# Patient Record
Sex: Female | Born: 1980 | Race: White | Hispanic: Yes | Smoking: Never smoker
Health system: Southern US, Community
[De-identification: ages and names within clinical notes are randomized; demographics above are authoritative.]

## PROBLEM LIST (undated history)

## (undated) DIAGNOSIS — Z62810 Personal history of physical and sexual abuse in childhood: Secondary | ICD-10-CM

## (undated) DIAGNOSIS — R102 Pelvic and perineal pain: Secondary | ICD-10-CM

## (undated) DIAGNOSIS — I10 Essential (primary) hypertension: Secondary | ICD-10-CM

## (undated) DIAGNOSIS — N342 Other urethritis: Secondary | ICD-10-CM

## (undated) DIAGNOSIS — K5909 Other constipation: Secondary | ICD-10-CM

## (undated) DIAGNOSIS — G8929 Other chronic pain: Secondary | ICD-10-CM

## (undated) DIAGNOSIS — F419 Anxiety disorder, unspecified: Secondary | ICD-10-CM

## (undated) HISTORY — PX: CHOLECYSTECTOMY: SHX55

---

## 2013-11-26 ENCOUNTER — Emergency Department: Payer: Self-pay | Admitting: Emergency Medicine

## 2013-11-26 LAB — CBC WITH DIFFERENTIAL/PLATELET
BASOS ABS: 0 10*3/uL (ref 0.0–0.1)
Basophil %: 0.4 %
EOS PCT: 0.6 %
Eosinophil #: 0.1 10*3/uL (ref 0.0–0.7)
HCT: 45 % (ref 35.0–47.0)
HGB: 14.7 g/dL (ref 12.0–16.0)
LYMPHS ABS: 2.3 10*3/uL (ref 1.0–3.6)
Lymphocyte %: 19.4 %
MCH: 30.3 pg (ref 26.0–34.0)
MCHC: 32.8 g/dL (ref 32.0–36.0)
MCV: 93 fL (ref 80–100)
MONO ABS: 0.6 x10 3/mm (ref 0.2–0.9)
MONOS PCT: 5.4 %
NEUTROS ABS: 8.7 10*3/uL — AB (ref 1.4–6.5)
Neutrophil %: 74.2 %
Platelet: 321 10*3/uL (ref 150–440)
RBC: 4.86 10*6/uL (ref 3.80–5.20)
RDW: 12.9 % (ref 11.5–14.5)
WBC: 11.8 10*3/uL — AB (ref 3.6–11.0)

## 2013-11-26 LAB — COMPREHENSIVE METABOLIC PANEL
AST: 26 U/L (ref 15–37)
Albumin: 4.1 g/dL (ref 3.4–5.0)
Alkaline Phosphatase: 64 U/L
Anion Gap: 6 — ABNORMAL LOW (ref 7–16)
BILIRUBIN TOTAL: 0.5 mg/dL (ref 0.2–1.0)
BUN: 11 mg/dL (ref 7–18)
CO2: 28 mmol/L (ref 21–32)
Calcium, Total: 9.1 mg/dL (ref 8.5–10.1)
Chloride: 104 mmol/L (ref 98–107)
Creatinine: 0.65 mg/dL (ref 0.60–1.30)
EGFR (Non-African Amer.): >60
Glucose: 111 mg/dL — ABNORMAL HIGH (ref 65–99)
OSMOLALITY: 276 (ref 275–301)
Potassium: 3.6 mmol/L (ref 3.5–5.1)
SGPT (ALT): 36 U/L
SODIUM: 138 mmol/L (ref 136–145)
TOTAL PROTEIN: 8.3 g/dL — AB (ref 6.4–8.2)

## 2013-11-26 LAB — URINALYSIS, COMPLETE
BACTERIA: NONE SEEN
BILIRUBIN, UR: NEGATIVE
Glucose,UR: NEGATIVE mg/dL (ref 0–75)
KETONE: NEGATIVE
LEUKOCYTE ESTERASE: NEGATIVE
Nitrite: NEGATIVE
PH: 5 (ref 4.5–8.0)
Protein: NEGATIVE
RBC,UR: 15 /HPF (ref 0–5)
Specific Gravity: 1.019 (ref 1.003–1.030)
Squamous Epithelial: 11
WBC UR: 6 /HPF (ref 0–5)

## 2013-11-26 LAB — GC/CHLAMYDIA PROBE AMP

## 2013-11-26 LAB — WET PREP, GENITAL

## 2013-11-26 LAB — LIPASE, BLOOD: Lipase: 184 U/L (ref 73–393)

## 2014-05-05 ENCOUNTER — Emergency Department: Payer: Self-pay | Admitting: Student

## 2014-05-05 LAB — URINALYSIS, COMPLETE
BACTERIA: NONE SEEN
Bilirubin,UR: NEGATIVE
GLUCOSE, UR: NEGATIVE mg/dL (ref 0–75)
KETONE: NEGATIVE
LEUKOCYTE ESTERASE: NEGATIVE
Nitrite: NEGATIVE
PH: 5 (ref 4.5–8.0)
Protein: NEGATIVE
RBC,UR: 12 /HPF (ref 0–5)
SPECIFIC GRAVITY: 1.021 (ref 1.003–1.030)
Squamous Epithelial: 3
WBC UR: 6 /HPF (ref 0–5)

## 2015-04-10 ENCOUNTER — Encounter: Payer: Self-pay | Admitting: *Deleted

## 2015-04-10 ENCOUNTER — Emergency Department: Payer: No Typology Code available for payment source

## 2015-04-10 ENCOUNTER — Emergency Department
Admission: EM | Admit: 2015-04-10 | Discharge: 2015-04-10 | Disposition: A | Discharge status: Home / Self Care | Payer: No Typology Code available for payment source | Attending: Emergency Medicine | Admitting: Emergency Medicine

## 2015-04-10 DIAGNOSIS — M62838 Other muscle spasm: Secondary | ICD-10-CM | POA: Diagnosis not present

## 2015-04-10 DIAGNOSIS — R51 Headache: Secondary | ICD-10-CM | POA: Diagnosis present

## 2015-04-10 DIAGNOSIS — F419 Anxiety disorder, unspecified: Secondary | ICD-10-CM | POA: Insufficient documentation

## 2015-04-10 MED ORDER — KETOROLAC TROMETHAMINE 30 MG/ML IJ SOLN
INTRAMUSCULAR | Status: AC
  Administered 2015-04-10: 30 mg via INTRAMUSCULAR
  Filled 2015-04-10: qty 1

## 2015-04-10 MED ORDER — NAPROXEN 500 MG PO TABS: 500.0000 mg | ORAL_TABLET | Freq: Two times a day (BID) | ORAL | Status: AC

## 2015-04-10 MED ORDER — DIAZEPAM 5 MG PO TABS: 5.0000 mg | ORAL_TABLET | Freq: Three times a day (TID) | ORAL | Status: AC | PRN

## 2015-04-10 MED ORDER — KETOROLAC TROMETHAMINE 30 MG/ML IJ SOLN
30.0000 mg | Freq: Once | INTRAMUSCULAR | Status: AC
Start: 2015-04-10 — End: 2015-04-10
  Administered 2015-04-10: 30 mg via INTRAMUSCULAR

## 2015-04-10 NOTE — ED Provider Notes (Signed)
[facility] Emergency Department Provider Note  ____________________________________________  Time seen: On arrival  I have reviewed the triage vital signs and the nursing notes.   HISTORY  Chief Complaint Headache    HPI Becky Ball is a 34 y.o. female who presents with complaints of neck pain for approximately one month. She notes the pain is in the bilateral inferior skull essentially at the site of trapezius insertion. No fevers or chills. She has tried muscle relaxants from her doctor which did not help.No trauma to the area. 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 no neuro deficits, no blurry vision, no sensory deficits     History reviewed. No pertinent past medical history.  There are no active problems to display for this patient.   Past Surgical History  Procedure Laterality Date  . Cholecystectomy      No current outpatient prescriptions on file.  Allergies Review of patient's allergies indicates no known allergies.  No family history on file.  Social History Social History  Substance Use Topics  . Smoking status: Never Smoker   . Smokeless tobacco: None  . Alcohol Use: No    Review of Systems  Constitutional: Negative for fever. Eyes: Negative for visual changes. ENT: Negative for sore throat Cardiovascular: Negative for chest pain. Respiratory: Negative for shortness of breath. Gastrointestinal: Negative for abdominal pain, vomiting and diarrhea. Genitourinary: Negative for dysuria. Musculoskeletal: Negative for back pain. Skin: Negative for rash. Neurological: Negative for headaches or focal weakness Psychiatric: Mild anxiety    ____________________________________________   PHYSICAL EXAM:  VITAL SIGNS: ED Triage Vitals  Enc Vitals Group     BP 04/10/15 1649 162/92 mmHg     Pulse Rate 04/10/15 1649 107     Resp 04/10/15 1649 20     Temp 04/10/15  1649 98 F (36.7 C)     Temp Source 04/10/15 1649 Oral     SpO2 04/10/15 1649 10 %     Weight 04/10/15 1650 129 lb (58.514 kg)     Height 04/10/15 1649  (1.549 m)     Head Cir --      Peak Flow --      Pain Score 04/10/15 1651 8     Pain Loc --      Pain Edu? --      Excl. in GC? --      Constitutional: Alert and oriented. Well appearing and in no distress. Eyes: Conjunctivae are normal.  ENT   Head: Normocephalic and atraumatic.   Mouth/Throat: Mucous membranes are moist. Cardiovascular: Normal rate, regular rhythm. Normal and symmetric distal pulses are present in all extremities. No murmurs, rubs, or gallops. Respiratory: Normal respiratory effort without tachypnea nor retractions. Breath sounds are clear and equal bilaterally.   Genitourinary: deferred Musculoskeletal: Nontender with normal range of motion in all extremities. No lower extremity tenderness nor edema. Normal range of motion of the neck. No vertebral tenderness to palpation. She does have tenderness to palpation at the site of the insertion of the trapezius bilaterally. Neurologic:  Normal speech and language. No gross focal neurologic deficits are appreciated. Cranial nerves II-12 are intact Skin:  Skin is warm, dry and intact. No rash noted. Psychiatric: Mood and affect are normal. Patient exhibits appropriate insight and judgment.  ____________________________________________    LABS (pertinent positives/negatives)  Labs Reviewed - No data to display  ____________________________________________   EKG  None  ____________________________________________  RADIOLOGY I have personally reviewed any xrays that were ordered on this patient: CT head normal CT cervical spine unremarkable ____________________________________________   PROCEDURES  Procedure(s) performed: none  Critical Care performed: none  ____________________________________________   INITIAL IMPRESSION /  ASSESSMENT AND PLAN / ED COURSE  Pertinent labs & imaging results that were available during my care of the patient were reviewed by me and considered in my medical decision making (see chart for details).  Patient with neck pain for approximately one month. She does have good outpatient follow-up and has an appointment this week. We have obtained imaging which shows no bony abnormalities but I have emphasized that MRI may be required as an outpatient if this continues. We will give Valium and Naprosyn  ____________________________________________   FINAL CLINICAL IMPRESSION(S) / ED DIAGNOSES  Final diagnoses:  Muscle spasms of neck     Jene Everyobert Joaquim Tolen, MD 04/10/15 [phone]

## 2015-04-10 NOTE — ED Notes (Signed)
Pt discharged home after verbalizing understanding of discharge instructions; nad noted. 

## 2015-04-10 NOTE — ED Notes (Signed)
Pt with neck pain and headache x 1 month. Denies accident. Equal, strong grips. Perrl. A/o.

## 2015-04-10 NOTE — ED Notes (Signed)
Pain in back of head past month, , now has some reddness in eyes

## 2015-04-10 NOTE — Discharge Instructions (Signed)
Heat Therapy  Heat therapy can help ease sore, stiff, injured, and tight muscles and joints. Heat relaxes your muscles, which may help ease your pain.   RISKS AND COMPLICATIONS  If you have any of the following conditions, do not use heat therapy unless your health care provider has approved:   Poor circulation.   Healing wounds or scarred skin in the area being treated.   Diabetes, heart disease, or high blood pressure.   Not being able to feel (numbness) the area being treated.   Unusual swelling of the area being treated.   Active infections.   Blood clots.   Cancer.   Inability to communicate pain. This may include young children and people who have problems with their brain function (dementia).   Pregnancy.  Heat therapy should only be used on old, pre-existing, or long-lasting (chronic) injuries. Do not use heat therapy on new injuries unless directed by your health care provider.  HOW TO USE HEAT THERAPY  There are several different kinds of heat therapy, including:   Moist heat pack.   Warm water bath.   Hot water bottle.   Electric heating pad.   Heated gel pack.   Heated wrap.   Electric heating pad.  Use the heat therapy method suggested by your health care provider. Follow your health care provider's instructions on when and how to use heat therapy.  GENERAL HEAT THERAPY RECOMMENDATIONS   Do not sleep while using heat therapy. Only use heat therapy while you are awake.   Your skin may turn pink while using heat therapy. Do not use heat therapy if your skin turns red.   Do not use heat therapy if you have new pain.   High heat or long exposure to heat can cause burns. Be careful when using heat therapy to avoid burning your skin.   Do not use heat therapy on areas of your skin that are already irritated, such as with a rash or sunburn.  SEEK MEDICAL CARE IF:   You have blisters, redness, swelling, or numbness.   You have new pain.   Your pain is worse.  MAKE SURE  YOU:   Understand these instructions.   Will watch your condition.   Will get help right away if you are not doing well or get worse.     This information is not intended to replace advice given to you by your health care provider. Make sure you discuss any questions you have with your health care provider.     Document Released: 07/08/2011 Document Revised: 05/06/2014 Document Reviewed: 06/08/2013  Elsevier Interactive Patient Education 2016 [employer]

## 2015-06-04 ENCOUNTER — Ambulatory Visit
Admission: EM | Admit: 2015-06-04 | Discharge: 2015-06-04 | Payer: BLUE CROSS/BLUE SHIELD | Attending: Family Medicine | Admitting: Family Medicine

## 2015-06-04 ENCOUNTER — Encounter: Payer: Self-pay | Admitting: Gynecology

## 2015-06-04 DIAGNOSIS — R102 Pelvic and perineal pain unspecified side: Secondary | ICD-10-CM

## 2015-06-04 DIAGNOSIS — N739 Female pelvic inflammatory disease, unspecified: Secondary | ICD-10-CM

## 2015-06-04 HISTORY — DX: Other constipation: K59.09

## 2015-06-04 HISTORY — DX: Personal history of physical and sexual abuse in childhood: Z62.810

## 2015-06-04 HISTORY — DX: Anxiety disorder, unspecified: F41.9

## 2015-06-04 HISTORY — DX: Pelvic and perineal pain: R10.2

## 2015-06-04 HISTORY — DX: Other urethritis: N34.2

## 2015-06-04 HISTORY — DX: Other chronic pain: G89.29

## 2015-06-04 LAB — BASIC METABOLIC PANEL
Anion gap: 8 (ref 5–15)
BUN: 11 mg/dL (ref 6–20)
CALCIUM: 9.1 mg/dL (ref 8.9–10.3)
CO2: 24 mmol/L (ref 22–32)
CREATININE: 0.42 mg/dL — AB (ref 0.44–1.00)
Chloride: 102 mmol/L (ref 101–111)
GFR calc non Af Amer: >60 mL/min (ref >60)
GLUCOSE: 92 mg/dL (ref 65–99)
Potassium: 3.6 mmol/L (ref 3.5–5.1)
Sodium: 134 mmol/L — ABNORMAL LOW (ref 135–145)

## 2015-06-04 LAB — WET PREP, GENITAL
CLUE CELLS WET PREP: NONE SEEN
SPERM: NONE SEEN
Trich, Wet Prep: NONE SEEN
Yeast Wet Prep HPF POC: NONE SEEN

## 2015-06-04 LAB — URINALYSIS COMPLETE WITH MICROSCOPIC (ARMC ONLY)
Bilirubin Urine: NEGATIVE
GLUCOSE, UA: NEGATIVE mg/dL
Ketones, ur: NEGATIVE mg/dL
NITRITE: NEGATIVE
PH: 7 (ref 5.0–8.0)
Protein, ur: NEGATIVE mg/dL
SPECIFIC GRAVITY, URINE: 1.02 (ref 1.005–1.030)

## 2015-06-04 LAB — CBC WITH DIFFERENTIAL/PLATELET
BASOS PCT: 1 %
Basophils Absolute: 0.1 10*3/uL (ref 0–0.1)
Eosinophils Absolute: 0.2 10*3/uL (ref 0–0.7)
Eosinophils Relative: 1 %
HEMATOCRIT: 40 % (ref 35.0–47.0)
Hemoglobin: 13.6 g/dL (ref 12.0–16.0)
Lymphocytes Relative: 22 %
Lymphs Abs: 2.7 10*3/uL (ref 1.0–3.6)
MCH: 30.2 pg (ref 26.0–34.0)
MCHC: 33.9 g/dL (ref 32.0–36.0)
MCV: 89.1 fL (ref 80.0–100.0)
MONO ABS: 0.9 10*3/uL (ref 0.2–0.9)
MONOS PCT: 8 %
NEUTROS ABS: 8 10*3/uL — AB (ref 1.4–6.5)
Neutrophils Relative %: 68 %
Platelets: 300 10*3/uL (ref 150–440)
RBC: 4.49 MIL/uL (ref 3.80–5.20)
RDW: 13.1 % (ref 11.5–14.5)
WBC: 11.8 10*3/uL — ABNORMAL HIGH (ref 3.6–11.0)

## 2015-06-04 LAB — CHLAMYDIA/NGC RT PCR (ARMC ONLY)
CHLAMYDIA TR: NOT DETECTED
N gonorrhoeae: NOT DETECTED

## 2015-06-04 LAB — PREGNANCY, URINE: PREG TEST UR: NEGATIVE

## 2015-06-04 LAB — HCG, QUANTITATIVE, PREGNANCY: hCG, Beta Chain, Quant, S: <1 m[IU]/mL (ref <5)

## 2015-06-04 MED ORDER — DOXYCYCLINE HYCLATE 100 MG PO CAPS: 100.0000 mg | ORAL_CAPSULE | Freq: Two times a day (BID) | ORAL | Status: DC

## 2015-06-04 MED ORDER — TRAMADOL HCL 50 MG PO TABS: 50.0000 mg | ORAL_TABLET | Freq: Three times a day (TID) | ORAL | Status: DC | PRN

## 2015-06-04 MED ORDER — METRONIDAZOLE 500 MG PO TABS: 500.0000 mg | ORAL_TABLET | Freq: Two times a day (BID) | ORAL | Status: DC

## 2015-06-04 MED ORDER — IBUPROFEN 600 MG PO TABS: 600.0000 mg | ORAL_TABLET | Freq: Three times a day (TID) | ORAL | Status: AC | PRN

## 2015-06-04 MED ORDER — CEFTRIAXONE SODIUM 1 G IJ SOLR
1.0000 g | Freq: Once | INTRAMUSCULAR | Status: AC
  Administered 2015-06-04: 1 g via INTRAMUSCULAR
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
## 2015-06-04 NOTE — Discharge Instructions (Signed)
Go directly to ER as discussed.   Take medication as prescribed. Rest. Follow up closely with your primary doctor and OBGYN this week.   Return to Urgent care for new or worsening concerns.   Pelvic Inflammatory Disease Pelvic inflammatory disease (PID) refers to an infection in some or all of the female organs. The infection can be in the uterus, ovaries, fallopian tubes, or the surrounding tissues in the pelvis. PID can cause abdominal or pelvic pain that comes on suddenly (acute pelvic pain). PID is a serious infection because it can lead to lasting (chronic) pelvic pain or the inability to have children (infertility). CAUSES This condition is most often caused by an infection that is spread during sexual contact. However, the infection can also be caused by the normal bacteria that are found in the vaginal tissues if these bacteria travel upward into the reproductive organs. PID can also occur following:  The birth of a baby.  A miscarriage.  An abortion.  Major pelvic surgery.  The use of an intrauterine device (IUD).  A sexual assault. RISK FACTORS This condition is more likely to develop in women who:  Are younger than 37 years of age.  Are sexually active at [facility] age.  Use nonbarrier contraception.  Have multiple sexual partners.  Have sex with someone who has symptoms of an STD (sexually transmitted disease).  Use oral contraception. At times, certain behaviors can also increase the possibility of getting PID, such as:  Using a vaginal douche.  Having an IUD in place. SYMPTOMS Symptoms of this condition include:  Abdominal or pelvic pain.  Fever.  Chills.  Abnormal vaginal discharge.  Abnormal uterine bleeding.  Unusual pain shortly after the end of a menstrual period.  Painful urination.  Pain with sexual intercourse.  Nausea and vomiting. DIAGNOSIS To diagnose this condition, your health care provider will do a physical exam and take your  medical history. A pelvic exam typically reveals great tenderness in the uterus and the surrounding pelvic tissues. You may also have tests, such as:  Lab tests, including a pregnancy test, blood tests, and urine test.  Culture tests of the vagina and cervix to check for an STD.  Ultrasound.  A laparoscopic procedure to look inside the pelvis.  Examining vaginal secretions under a microscope. TREATMENT Treatment for this condition may involve one or more approaches.  Antibiotic medicines may be prescribed to be taken by mouth.  Sexual partners may need to be treated if the infection is caused by an STD.  For more severe cases, hospitalization may be needed to give antibiotics directly into a vein through an IV tube.  Surgery may be needed if other treatments do not help, but this is rare. It may take weeks until you are completely well. If you are diagnosed with PID, you should also be checked for human immunodeficiency virus (HIV). Your health care provider may test you for infection again 3 months after treatment. You should not have unprotected sex. HOME CARE INSTRUCTIONS  Take over-the-counter and prescription medicines only as told by your health care provider.  If you were prescribed an antibiotic medicine, take it as told by your health care provider. Do not stop taking the antibiotic even if you start to feel better.  Do not have sexual intercourse until treatment is completed or as told by your health care provider. If PID is confirmed, your recent sexual partners will need treatment, especially if you had unprotected sex.  Keep all follow-up visits as  told by your health care provider. This is important. SEEK MEDICAL CARE IF:  You have increased or abnormal vaginal discharge.  Your pain does not improve.  You vomit.  You have a fever.  You cannot tolerate your medicines.  Your partner has an STD.  You have pain when you urinate. SEEK IMMEDIATE MEDICAL CARE  IF:  You have increased abdominal or pelvic pain.  You have chills.  Your symptoms are not better in 72 hours even with treatment.   This information is not intended to replace advice given to you by your health care provider. Make sure you discuss any questions you have with your health care provider.   Document Released: 04/15/2005 Document Revised: 01/04/2015 Document Reviewed: 05/23/2014 Elsevier Interactive Patient Education [employer]

## 2015-06-04 NOTE — ED Notes (Addendum)
Patient c/o lower abdomen pain / vaginal itching x 2 weeks

## 2015-06-06 ENCOUNTER — Encounter: Payer: Self-pay | Admitting: Emergency Medicine

## 2015-06-06 ENCOUNTER — Emergency Department
Admission: EM | Admit: 2015-06-06 | Discharge: 2015-06-06 | Disposition: A | Discharge status: Home / Self Care | Payer: BLUE CROSS/BLUE SHIELD | Attending: Emergency Medicine | Admitting: Emergency Medicine

## 2015-06-06 ENCOUNTER — Emergency Department: Payer: BLUE CROSS/BLUE SHIELD

## 2015-06-06 DIAGNOSIS — Z79899 Other long term (current) drug therapy: Secondary | ICD-10-CM | POA: Insufficient documentation

## 2015-06-06 DIAGNOSIS — Z3202 Encounter for pregnancy test, result negative: Secondary | ICD-10-CM | POA: Insufficient documentation

## 2015-06-06 DIAGNOSIS — R102 Pelvic and perineal pain: Secondary | ICD-10-CM

## 2015-06-06 DIAGNOSIS — N898 Other specified noninflammatory disorders of vagina: Secondary | ICD-10-CM | POA: Insufficient documentation

## 2015-06-06 DIAGNOSIS — Z792 Long term (current) use of antibiotics: Secondary | ICD-10-CM | POA: Diagnosis not present

## 2015-06-06 DIAGNOSIS — Z791 Long term (current) use of non-steroidal anti-inflammatories (NSAID): Secondary | ICD-10-CM | POA: Diagnosis not present

## 2015-06-06 DIAGNOSIS — R1030 Lower abdominal pain, unspecified: Secondary | ICD-10-CM | POA: Diagnosis not present

## 2015-06-06 LAB — URINALYSIS COMPLETE WITH MICROSCOPIC (ARMC ONLY)
BACTERIA UA: NONE SEEN
Bilirubin Urine: NEGATIVE
Glucose, UA: NEGATIVE mg/dL
Ketones, ur: NEGATIVE mg/dL
Nitrite: NEGATIVE
PH: 6 (ref 5.0–8.0)
PROTEIN: NEGATIVE mg/dL
Specific Gravity, Urine: 1.014 (ref 1.005–1.030)

## 2015-06-06 LAB — COMPREHENSIVE METABOLIC PANEL
ALK PHOS: 56 U/L (ref 38–126)
ALT: 32 U/L (ref 14–54)
AST: 46 U/L — AB (ref 15–41)
Albumin: 4.7 g/dL (ref 3.5–5.0)
Anion gap: 3 — ABNORMAL LOW (ref 5–15)
BUN: 15 mg/dL (ref 6–20)
CALCIUM: 8.9 mg/dL (ref 8.9–10.3)
CHLORIDE: 102 mmol/L (ref 101–111)
CO2: 30 mmol/L (ref 22–32)
CREATININE: 0.47 mg/dL (ref 0.44–1.00)
GFR calc non Af Amer: >60 mL/min (ref >60)
GLUCOSE: 90 mg/dL (ref 65–99)
Potassium: 3.4 mmol/L — ABNORMAL LOW (ref 3.5–5.1)
SODIUM: 135 mmol/L (ref 135–145)
Total Bilirubin: 0.6 mg/dL (ref 0.3–1.2)
Total Protein: 7.5 g/dL (ref 6.5–8.1)

## 2015-06-06 LAB — URINE CULTURE: Culture: 1000

## 2015-06-06 LAB — LIPASE, BLOOD: Lipase: 29 U/L (ref 11–51)

## 2015-06-06 LAB — CBC WITH DIFFERENTIAL/PLATELET
BASOS ABS: 0.1 10*3/uL (ref 0–0.1)
Basophils Relative: 1 %
EOS ABS: 0.2 10*3/uL (ref 0–0.7)
Eosinophils Relative: 2 %
HCT: 38.7 % (ref 35.0–47.0)
HEMOGLOBIN: 13.1 g/dL (ref 12.0–16.0)
LYMPHS ABS: 2.3 10*3/uL (ref 1.0–3.6)
LYMPHS PCT: 21 %
MCH: 29.9 pg (ref 26.0–34.0)
MCHC: 33.9 g/dL (ref 32.0–36.0)
MCV: 88.1 fL (ref 80.0–100.0)
Monocytes Absolute: 0.7 10*3/uL (ref 0.2–0.9)
Monocytes Relative: 6 %
NEUTROS PCT: 70 %
Neutro Abs: 7.8 10*3/uL — ABNORMAL HIGH (ref 1.4–6.5)
Platelets: 298 10*3/uL (ref 150–440)
RBC: 4.4 MIL/uL (ref 3.80–5.20)
RDW: 13.1 % (ref 11.5–14.5)
WBC: 11 10*3/uL (ref 3.6–11.0)

## 2015-06-06 LAB — POCT PREGNANCY, URINE: PREG TEST UR: NEGATIVE

## 2015-06-06 NOTE — ED Notes (Signed)
abd pain onset 3 days ago.  Skin w/d with good color.

## 2015-06-06 NOTE — ED Notes (Addendum)
C/c pelvic pain x 3 weeks. Seen at urgent care 2 days ago, had pelvic exam. Received treatment for STDs. Currently on doxycycline and metronidazole with no relief. LMP 05/25/2015

## 2015-06-06 NOTE — ED Notes (Signed)
Patient transported to Ultrasound 

## 2015-06-06 NOTE — Discharge Instructions (Signed)
Abdominal Pain, Adult °Many things can cause abdominal pain. Usually, abdominal pain is not caused by a disease and will improve without treatment. It can often be observed and treated at home. Your health care provider will do a physical exam and possibly order blood tests and X-rays to help determine the seriousness of your pain. However, in many cases, more time must pass before a clear cause of the pain can be found. Before that point, your health care provider may not know if you need more testing or further treatment. °HOME CARE INSTRUCTIONS °Monitor your abdominal pain for any changes. The following actions may help to alleviate any discomfort you are experiencing: °· Only take over-the-counter or prescription medicines as directed by your health care provider. °· Do not take laxatives unless directed to do so by your health care provider. °· Try a clear liquid diet (broth, tea, or water) as directed by your health care provider. Slowly move to a bland diet as tolerated. °SEEK MEDICAL CARE IF: °· You have unexplained abdominal pain. °· You have abdominal pain associated with nausea or diarrhea. °· You have pain when you urinate or have a bowel movement. °· You experience abdominal pain that wakes you in the night. °· You have abdominal pain that is worsened or improved by eating food. °· You have abdominal pain that is worsened with eating fatty foods. °· You have a fever. °SEEK IMMEDIATE MEDICAL CARE IF: °· Your pain does not go away within 2 hours. °· You keep throwing up (vomiting). °· Your pain is felt only in portions of the abdomen, such as the right side or the left lower portion of the abdomen. °· You pass bloody or black tarry stools. °MAKE SURE YOU: °· Understand these instructions. °· Will watch your condition. °· Will get help right away if you are not doing well or get worse. °  °This information is not intended to replace advice given to you by your health care provider. Make sure you discuss  any questions you have with your health care provider. °  °Document Released: 01/23/2005 Document Revised: 01/04/2015 Document Reviewed: 12/23/2012 °Elsevier Interactive Patient Education ©2016 [employer] °Pelvic Pain, Female °Female pelvic pain can be caused by many different things and start from a variety of places. Pelvic pain refers to pain that is located in the lower half of the abdomen and between your hips. The pain may occur over a short period of time (acute) or may be reoccurring (chronic). The cause of pelvic pain may be related to disorders affecting the female reproductive organs (gynecologic), but it may also be related to the bladder, kidney stones, an intestinal complication, or muscle or skeletal problems. Getting help right away for pelvic pain is important, especially if there has been severe, sharp, or a sudden onset of unusual pain. It is also important to get help right away because some types of pelvic pain can be life threatening.  °CAUSES  °Below are only some of the causes of pelvic pain. The causes of pelvic pain can be in one of several categories.  °· Gynecologic. °¨ Pelvic inflammatory disease. °¨ Sexually transmitted infection. °¨ Ovarian cyst or a twisted ovarian ligament (ovarian torsion). °¨ Uterine lining that grows outside the uterus (endometriosis). °¨ Fibroids, cysts, or tumors. °¨ Ovulation. °· Pregnancy. °¨ Pregnancy that occurs outside the uterus (ectopic pregnancy). °¨ Miscarriage. °¨ Labor. °¨ Abruption of the placenta or ruptured uterus. °· Infection. °¨ Uterine infection (endometritis). °¨ Bladder infection. °¨ Diverticulitis. °¨   Miscarriage related to a uterine infection (septic abortion). °· Bladder. °¨ Inflammation of the bladder (cystitis). °¨ Kidney stone(s). °· Gastrointestinal. °¨ Constipation. °¨ Diverticulitis. °· Neurologic. °¨ Trauma. °¨ Feeling pelvic pain because of mental or emotional causes (psychosomatic). °· Cancers of the bowel or  pelvis. °EVALUATION  °Your caregiver will want to take a careful history of your concerns. This includes recent changes in your health, a careful gynecologic history of your periods (menses), and a sexual history. Obtaining your family history and medical history is also important. Your caregiver may suggest a pelvic exam. A pelvic exam will help identify the location and severity of the pain. It also helps in the evaluation of which organ system may be involved. In order to identify the cause of the pelvic pain and be properly treated, your caregiver may order tests. These tests may include:  °· A pregnancy test. °· Pelvic ultrasonography. °· An X-ray exam of the abdomen. °· A urinalysis or evaluation of vaginal discharge. °· Blood tests. °HOME CARE INSTRUCTIONS  °· Only take over-the-counter or prescription medicines for pain, discomfort, or fever as directed by your caregiver.   °· Rest as directed by your caregiver.   °· Eat a balanced diet.   °· Drink enough fluids to make your urine clear or pale yellow, or as directed.   °· Avoid sexual intercourse if it causes pain.   °· Apply warm or cold compresses to the lower abdomen depending on which one helps the pain.   °· Avoid stressful situations.   °· Keep a journal of your pelvic pain. Write down when it started, where the pain is located, and if there are things that seem to be associated with the pain, such as food or your menstrual cycle. °· Follow up with your caregiver as directed.   °SEEK MEDICAL CARE IF: °· Your medicine does not help your pain. °· You have abnormal vaginal discharge. °SEEK IMMEDIATE MEDICAL CARE IF:  °· You have heavy bleeding from the vagina.   °· Your pelvic pain increases.   °· You feel light-headed or faint.   °· You have chills.   °· You have pain with urination or blood in your urine.   °· You have uncontrolled diarrhea or vomiting.   °· You have a fever or persistent symptoms for more than 3 days. °· You have a fever and your  symptoms suddenly get worse.   °· You are being physically or sexually abused. °  °This information is not intended to replace advice given to you by your health care provider. Make sure you discuss any questions you have with your health care provider. °  °Document Released: 03/12/2004 Document Revised: 01/04/2015 Document Reviewed: 08/05/2011 °Elsevier Interactive Patient Education ©2016 [employer] ° °
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
## 2017-06-14 ENCOUNTER — Emergency Department
Admission: EM | Admit: 2017-06-14 | Discharge: 2017-06-14 | Disposition: A | Discharge status: Home / Self Care | Payer: BLUE CROSS/BLUE SHIELD | Attending: Student in an Organized Health Care Education/Training Program | Admitting: Student in an Organized Health Care Education/Training Program

## 2017-06-14 ENCOUNTER — Other Ambulatory Visit: Payer: Self-pay

## 2017-06-14 DIAGNOSIS — I10 Essential (primary) hypertension: Secondary | ICD-10-CM | POA: Insufficient documentation

## 2017-06-14 DIAGNOSIS — Z79899 Other long term (current) drug therapy: Secondary | ICD-10-CM | POA: Insufficient documentation

## 2017-06-14 DIAGNOSIS — I159 Secondary hypertension, unspecified: Secondary | ICD-10-CM

## 2017-06-14 HISTORY — DX: Essential (primary) hypertension: I10

## 2017-06-14 LAB — CBC
HEMATOCRIT: 44.6 % (ref 35.0–47.0)
Hemoglobin: 15.2 g/dL (ref 12.0–16.0)
MCH: 30.7 pg (ref 26.0–34.0)
MCHC: 34.1 g/dL (ref 32.0–36.0)
MCV: 90.1 fL (ref 80.0–100.0)
Platelets: 368 10*3/uL (ref 150–440)
RBC: 4.95 MIL/uL (ref 3.80–5.20)
RDW: 13 % (ref 11.5–14.5)
WBC: 8.8 10*3/uL (ref 3.6–11.0)

## 2017-06-14 LAB — BASIC METABOLIC PANEL WITH GFR
Anion gap: 10 (ref 5–15)
BUN: 11 mg/dL (ref 6–20)
CO2: 22 mmol/L (ref 22–32)
Calcium: 8.8 mg/dL — ABNORMAL LOW (ref 8.9–10.3)
Chloride: 104 mmol/L (ref 101–111)
Creatinine, Ser: 0.59 mg/dL (ref 0.44–1.00)
GFR calc Af Amer: >60 mL/min (ref >60)
GFR calc non Af Amer: >60 mL/min (ref >60)
Glucose, Bld: 122 mg/dL — ABNORMAL HIGH (ref 65–99)
Potassium: 3.5 mmol/L (ref 3.5–5.1)
Sodium: 136 mmol/L (ref 135–145)

## 2017-06-14 LAB — TROPONIN I: Troponin I: <0.03 ng/mL (ref <0.03)

## 2017-06-14 MED ORDER — ACETAMINOPHEN 500 MG PO TABS
1000.0000 mg | ORAL_TABLET | Freq: Once | ORAL | Status: AC
  Administered 2017-06-14: 1000 mg via ORAL
  Filled 2017-06-14: qty 2

## 2017-06-14 MED ORDER — AMLODIPINE BESYLATE 2.5 MG PO TABS: 2.5000 mg | ORAL_TABLET | Freq: Every day | ORAL | 0 refills | Status: DC

## 2017-06-14 MED ORDER — AMLODIPINE BESYLATE 5 MG PO TABS
5.0000 mg | ORAL_TABLET | Freq: Once | ORAL | Status: AC
  Administered 2017-06-14: 5 mg via ORAL
  Filled 2017-06-14: qty 1

## 2017-06-14 NOTE — ED Notes (Signed)
Pt ambulatory to treatment room. No distress noted. States posterior HA and CP remain, worse with movement.
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
## 2017-06-14 NOTE — ED Notes (Signed)
EDP to bedside at this time 

## 2017-06-14 NOTE — ED Triage Notes (Signed)
Pt states she has HTN but is not on any medication yet. States she checked BP at home. Yesterday was in 170's. This AM was 169. In triage 183/108. Alert, oriented. States posterior HA and dizziness. Denies blurred vision. Took ASA for HA PTA. States HTN x 1 month.

## 2021-03-06 ENCOUNTER — Emergency Department: Payer: Medicaid Other

## 2021-03-06 ENCOUNTER — Encounter: Payer: Self-pay | Admitting: Emergency Medicine

## 2021-03-06 ENCOUNTER — Other Ambulatory Visit: Payer: Self-pay

## 2021-03-06 ENCOUNTER — Emergency Department
Admission: EM | Admit: 2021-03-06 | Discharge: 2021-03-06 | Disposition: A | Discharge status: Home / Self Care | Payer: Medicaid Other | Attending: Emergency Medicine | Admitting: Emergency Medicine

## 2021-03-06 DIAGNOSIS — H538 Other visual disturbances: Secondary | ICD-10-CM | POA: Insufficient documentation

## 2021-03-06 DIAGNOSIS — H53149 Visual discomfort, unspecified: Secondary | ICD-10-CM | POA: Insufficient documentation

## 2021-03-06 DIAGNOSIS — E876 Hypokalemia: Secondary | ICD-10-CM | POA: Diagnosis not present

## 2021-03-06 DIAGNOSIS — I10 Essential (primary) hypertension: Secondary | ICD-10-CM | POA: Diagnosis not present

## 2021-03-06 DIAGNOSIS — M79662 Pain in left lower leg: Secondary | ICD-10-CM | POA: Insufficient documentation

## 2021-03-06 DIAGNOSIS — Z79899 Other long term (current) drug therapy: Secondary | ICD-10-CM | POA: Insufficient documentation

## 2021-03-06 DIAGNOSIS — Z760 Encounter for issue of repeat prescription: Secondary | ICD-10-CM | POA: Diagnosis not present

## 2021-03-06 DIAGNOSIS — R252 Cramp and spasm: Secondary | ICD-10-CM

## 2021-03-06 DIAGNOSIS — R519 Headache, unspecified: Secondary | ICD-10-CM | POA: Insufficient documentation

## 2021-03-06 LAB — BASIC METABOLIC PANEL
Anion gap: 7 (ref 5–15)
BUN: 16 mg/dL (ref 6–20)
CO2: 27 mmol/L (ref 22–32)
Calcium: 9.2 mg/dL (ref 8.9–10.3)
Chloride: 101 mmol/L (ref 98–111)
Creatinine, Ser: 0.57 mg/dL (ref 0.44–1.00)
GFR, Estimated: >60 mL/min (ref >60)
Glucose, Bld: 116 mg/dL — ABNORMAL HIGH (ref 70–99)
Potassium: 3 mmol/L — ABNORMAL LOW (ref 3.5–5.1)
Sodium: 135 mmol/L (ref 135–145)

## 2021-03-06 LAB — CBC
HCT: 42.7 % (ref 36.0–46.0)
Hemoglobin: 14.9 g/dL (ref 12.0–15.0)
MCH: 30.8 pg (ref 26.0–34.0)
MCHC: 34.9 g/dL (ref 30.0–36.0)
MCV: 88.2 fL (ref 80.0–100.0)
Platelets: 316 10*3/uL (ref 150–400)
RBC: 4.84 MIL/uL (ref 3.87–5.11)
RDW: 13.3 % (ref 11.5–15.5)
WBC: 7.1 10*3/uL (ref 4.0–10.5)
nRBC: 0 % (ref 0.0–0.2)

## 2021-03-06 LAB — POC URINE PREG, ED: Preg Test, Ur: NEGATIVE

## 2021-03-06 LAB — MAGNESIUM: Magnesium: 2.1 mg/dL (ref 1.7–2.4)

## 2021-03-06 MED ORDER — PROCHLORPERAZINE EDISYLATE 10 MG/2ML IJ SOLN
10.0000 mg | Freq: Once | INTRAMUSCULAR | Status: AC
  Administered 2021-03-06: 10 mg via INTRAVENOUS
  Filled 2021-03-06: qty 2

## 2021-03-06 MED ORDER — LACTATED RINGERS IV BOLUS
1000.0000 mL | Freq: Once | INTRAVENOUS | Status: AC
  Administered 2021-03-06: 1000 mL via INTRAVENOUS

## 2021-03-06 MED ORDER — POTASSIUM CHLORIDE CRYS ER 20 MEQ PO TBCR
40.0000 meq | EXTENDED_RELEASE_TABLET | Freq: Once | ORAL | Status: DC
  Filled 2021-03-06: qty 2

## 2021-03-06 MED ORDER — POTASSIUM CHLORIDE CRYS ER 10 MEQ PO TBCR: 20.0000 meq | EXTENDED_RELEASE_TABLET | Freq: Every day | ORAL | 0 refills | Status: DC

## 2021-03-06 MED ORDER — MAGNESIUM SULFATE 2 GM/50ML IV SOLN
2.0000 g | Freq: Once | INTRAVENOUS | Status: AC
  Administered 2021-03-06: 2 g via INTRAVENOUS
  Filled 2021-03-06: qty 50

## 2021-03-06 MED ORDER — DIPHENHYDRAMINE HCL 50 MG/ML IJ SOLN
12.5000 mg | Freq: Once | INTRAMUSCULAR | Status: AC
  Administered 2021-03-06: 12.5 mg via INTRAVENOUS
  Filled 2021-03-06: qty 1
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
## 2021-03-06 NOTE — ED Triage Notes (Addendum)
Pt via POV from home. Pt c/o headache, blurred vision, and dizziness for the past 3 weeks. Pt also sensitive to light. Pt states that the headache is gotten worse. Pt has a knot on the back of head the size of a ping pong ball that she says is getting bigger. Denies hx of migraines. Denies NV.  Pt is A&OX4 and NAD.

## 2021-05-18 ENCOUNTER — Other Ambulatory Visit: Payer: Self-pay

## 2021-05-18 ENCOUNTER — Ambulatory Visit
Admission: EM | Admit: 2021-05-18 | Discharge: 2021-05-18 | Disposition: A | Discharge status: Home / Self Care | Payer: Medicaid Other | Attending: Emergency Medicine | Admitting: Emergency Medicine

## 2021-05-18 DIAGNOSIS — B9689 Other specified bacterial agents as the cause of diseases classified elsewhere: Secondary | ICD-10-CM | POA: Insufficient documentation

## 2021-05-18 DIAGNOSIS — N760 Acute vaginitis: Secondary | ICD-10-CM | POA: Insufficient documentation

## 2021-05-18 LAB — URINALYSIS, COMPLETE (UACMP) WITH MICROSCOPIC
Glucose, UA: NEGATIVE mg/dL
Nitrite: NEGATIVE
Protein, ur: 100 mg/dL — AB
RBC / HPF: >50 RBC/hpf (ref 0–5)
Specific Gravity, Urine: >1.03 — ABNORMAL HIGH (ref 1.005–1.030)
pH: 5.5 (ref 5.0–8.0)

## 2021-05-18 LAB — WET PREP, GENITAL
Sperm: NONE SEEN
Trich, Wet Prep: NONE SEEN
WBC, Wet Prep HPF POC: <10 — AB (ref <10)
Yeast Wet Prep HPF POC: NONE SEEN

## 2021-05-18 MED ORDER — METRONIDAZOLE 500 MG PO TABS: 500.0000 mg | ORAL_TABLET | Freq: Two times a day (BID) | ORAL | 0 refills | Status: AC
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
## 2021-05-18 NOTE — Discharge Instructions (Addendum)
Today you are being treated for  Bacterial vaginosis   You urinalysis was negative, your vaginal swab was negative for yeast and trichomoniasis,   Take Metronidazole 500 mg twice a day for 7 days, do not drink alcohol while using medication, this will make you feel sick   Bacterial vaginosis which results from an overgrowth of one on several organisms that are normally present in your vagina. Vaginosis is an inflammation of the vagina that can result in discharge, itching and pain.  Labs pending 2-3 days, you will be contacted if positive for any sti and treatment will be sent to the pharmacy, you will have to return to the clinic if positive for gonorrhea to receive treatment   Please refrain from having sex until labs results, if positive please refrain from having sex until treatment complete and symptoms resolve   In addition: Avoid baths, hot tubs and whirlpool spas.  Don't use scented or harsh soaps Avoid irritants. These include scented tampons and pads. Wipe from front to back after using the toilet. Don't douche. Your vagina doesn't require cleansing other than normal bathing.  Use a condom.  Wear cotton underwear, this fabric absorbs some moisture.

## 2021-05-18 NOTE — ED Triage Notes (Signed)
Pt c/o pain in her back and abdomen x1-2weeks. Pt states that she is on her menstraul and she states that it is early. Pt states that she is bleeding more than normal and that she took a home pregnancy test and it was negative. Pt states that she had a very light period from christmas to this month and now she is bleeding very heavily.

## 2021-05-21 LAB — CERVICOVAGINAL ANCILLARY ONLY
Chlamydia: NEGATIVE
Comment: NEGATIVE
Comment: NORMAL
Neisseria Gonorrhea: NEGATIVE

## 2021-05-25 ENCOUNTER — Emergency Department: Payer: Medicaid Other

## 2021-05-25 ENCOUNTER — Emergency Department
Admission: EM | Admit: 2021-05-25 | Discharge: 2021-05-25 | Disposition: A | Discharge status: Home / Self Care | Payer: Medicaid Other | Attending: Emergency Medicine | Admitting: Emergency Medicine

## 2021-05-25 ENCOUNTER — Other Ambulatory Visit: Payer: Self-pay

## 2021-05-25 DIAGNOSIS — S39012A Strain of muscle, fascia and tendon of lower back, initial encounter: Secondary | ICD-10-CM | POA: Insufficient documentation

## 2021-05-25 DIAGNOSIS — X58XXXA Exposure to other specified factors, initial encounter: Secondary | ICD-10-CM | POA: Diagnosis not present

## 2021-05-25 DIAGNOSIS — K5730 Diverticulosis of large intestine without perforation or abscess without bleeding: Secondary | ICD-10-CM | POA: Diagnosis not present

## 2021-05-25 DIAGNOSIS — K5790 Diverticulosis of intestine, part unspecified, without perforation or abscess without bleeding: Secondary | ICD-10-CM

## 2021-05-25 DIAGNOSIS — K5900 Constipation, unspecified: Secondary | ICD-10-CM

## 2021-05-25 DIAGNOSIS — S3992XA Unspecified injury of lower back, initial encounter: Secondary | ICD-10-CM | POA: Diagnosis present

## 2021-05-25 LAB — URINALYSIS, COMPLETE (UACMP) WITH MICROSCOPIC
Bilirubin Urine: NEGATIVE
Glucose, UA: NEGATIVE mg/dL
Ketones, ur: NEGATIVE mg/dL
Leukocytes,Ua: NEGATIVE
Nitrite: NEGATIVE
Protein, ur: NEGATIVE mg/dL
Specific Gravity, Urine: 1.01 (ref 1.005–1.030)
pH: 5 (ref 5.0–8.0)

## 2021-05-25 LAB — WET PREP, GENITAL
Clue Cells Wet Prep HPF POC: NONE SEEN
Sperm: NONE SEEN
Trich, Wet Prep: NONE SEEN
WBC, Wet Prep HPF POC: <10 (ref <10)
Yeast Wet Prep HPF POC: NONE SEEN

## 2021-05-25 LAB — POC URINE PREG, ED: Preg Test, Ur: NEGATIVE

## 2021-05-25 MED ORDER — ORPHENADRINE CITRATE 30 MG/ML IJ SOLN
60.0000 mg | INTRAMUSCULAR | Status: AC
  Administered 2021-05-25: 60 mg via INTRAMUSCULAR
  Filled 2021-05-25: qty 2

## 2021-05-25 MED ORDER — KETOROLAC TROMETHAMINE 30 MG/ML IJ SOLN
30.0000 mg | Freq: Once | INTRAMUSCULAR | Status: AC
  Administered 2021-05-25: 30 mg via INTRAMUSCULAR
  Filled 2021-05-25: qty 1

## 2021-05-25 MED ORDER — POTASSIUM CHLORIDE CRYS ER 10 MEQ PO TBCR: 20.0000 meq | EXTENDED_RELEASE_TABLET | Freq: Every day | ORAL | 0 refills | Status: AC

## 2021-05-25 MED ORDER — BACLOFEN 10 MG PO TABS: 10.0000 mg | ORAL_TABLET | Freq: Three times a day (TID) | ORAL | 0 refills | Status: AC | PRN

## 2021-05-25 MED ORDER — NABUMETONE 750 MG PO TABS: 750.0000 mg | ORAL_TABLET | Freq: Two times a day (BID) | ORAL | 0 refills | Status: AC

## 2021-05-25 MED ORDER — NITROFURANTOIN MONOHYD MACRO 100 MG PO CAPS: 100.0000 mg | ORAL_CAPSULE | Freq: Every day | ORAL | 0 refills | Status: AC

## 2021-05-25 NOTE — Discharge Instructions (Signed)
Take the prescription meds as directed. Follow-up with your primary provider for continued symptoms. Drink one 32-oz bottle of Gatorade with a 4 oz bottle of Miralax, and drink over 8 hours, to evacuate the bowels. Increase dietary fiber and liquids to reduce constipation.

## 2021-05-25 NOTE — ED Provider Notes (Signed)
[facility] Provider Note  Patient Contact: 3:28 PM (approximate)   History   Vaginal Itching   HPI  Becky Ball is a 41 y.o. female presents to the ED for lower abdominal discomfort.  Patient was treated with metronidazole over the last week for confirmed BV on pelvic exam.  She reports cessation of vaginal bleeding but still notes some pelvic discomfort.  She gives a history of chronic suprapubic pain.  Patient denies any dysuria, hematuria, or vaginal discharge. She does note some intermittent constipation, but denies fecal incontinence or rectal pain/bleeding.      Physical Exam   Triage Vital Signs: ED Triage Vitals  Enc Vitals Group     BP 05/25/21 1427 132/82     Pulse Rate 05/25/21 1427 77     Resp 05/25/21 1427 18     Temp 05/25/21 1427 98.9 F (37.2 C)     Temp Source 05/25/21 1427 Oral     SpO2 05/25/21 1427 98 %     Weight 05/25/21 1432 150 lb (68 kg)     Height 05/25/21 1432 5\' 1"  (1.549 m)     Head Circumference --      Peak Flow --      Pain Score 05/25/21 1432 5     Pain Loc --      Pain Edu? --      Excl. in [place]? --     Most recent vital signs: Vitals:   05/25/21 1427 05/25/21 1545  BP: 132/82 (!) 147/74  Pulse: 77 82  Resp: 18 17  Temp: 98.9 F (37.2 C) 98.4 F (36.9 C)  SpO2: 98% 99%     General: Alert and in no acute distress. Cardiovascular:  Good peripheral perfusion Respiratory: Normal respiratory effort without tachypnea or retractions. Lungs CTAB.  GU: deferred. Self-collected vaginal swab. Gastrointestinal: Bowel sounds 4 quadrants. Soft and nontender to palpation, except to the LLQ. No guarding or rigidity. No palpable masses. No distention. Mild left flank tenderness. Musculoskeletal: Full range of motion to all extremities.  Neurologic:  No gross focal neurologic deficits are appreciated.  Skin:   No rash noted Other:   ED Results / Procedures / Treatments   Labs (all labs ordered  are listed, but only abnormal results are displayed) Labs Reviewed  URINALYSIS, COMPLETE (UACMP) WITH MICROSCOPIC - Abnormal; Notable for the following components:      Result Value   Color, Urine STRAW (*)    Hgb urine dipstick MODERATE (*)    Bacteria, UA RARE (*)    All other components within normal limits  WET PREP, GENITAL  POC URINE PREG, ED     EKG   RADIOLOGY  I personally viewed and evaluated these images as part of my medical decision making, as well as reviewing the written report by the radiologist.  ED Provider Interpretation: NAD. Moderate colonic stool burden on plain films.  CT ABDOMEN PELVIS WO CONTRAST  Result Date: 05/25/2021 CLINICAL DATA:  Flank pain, pain left lower quadrant EXAM: CT ABDOMEN AND PELVIS WITHOUT CONTRAST TECHNIQUE: Multidetector CT imaging of the abdomen and pelvis was performed following the standard protocol without IV contrast. RADIATION DOSE REDUCTION: This exam was performed according to the departmental dose-optimization program which includes automated exposure control, adjustment of the mA and/or kV according to patient size and/or use of iterative reconstruction technique. COMPARISON:  05/05/2014 FINDINGS: Lower chest: Unremarkable. Hepatobiliary: Liver measures 16.1 cm in length. No focal abnormality is seen. Evaluation is limited  in this noncontrast study. Gallbladder is not seen and may have been removed. There is no dilation of bile ducts. Pancreas: No focal abnormality is seen. Spleen: Unremarkable. Adrenals/Urinary Tract: Adrenals are unremarkable. There is no hydronephrosis. There are no renal or ureteral stones. There is 1.8 x 1.6 cm exophytic lesion with mixed attenuation including foci of fat density. There is interval increase in size of this lesion. Findings may suggest angiomyolipoma or some of the neoplastic process. Follow-up renal sonogram and multiphasic CT or MRI should be considered. Urinary bladder is unremarkable.  Stomach/Bowel: Stomach is unremarkable. Small bowel loops are not dilated. Appendix is not dilated. There is no significant wall thickening in the colon. Scattered diverticula are seen in the colon without signs of focal acute diverticulitis. Vascular/Lymphatic: Unremarkable. Reproductive: Unremarkable. Other: There is no ascites or pneumoperitoneum. Musculoskeletal: Unremarkable. IMPRESSION: There is no evidence of intestinal obstruction or pneumoperitoneum. Appendix is not dilated. There is no hydronephrosis. Diverticulosis of colon without signs of focal diverticulitis. There is 1.8 x 1.6 cm partly exophytic lesion in the lateral margin of left kidney which has increased in size since 05/05/2014. There are foci of solid and fat attenuation in the lesion. This may suggest angiomyolipoma or some other neoplastic process. Follow-up renal sonogram and multiphasic CT or MRI if warranted should be considered. Electronically Signed   By: Elmer Picker M.D.   On: 05/25/2021 18:12   DG Lumbar Spine Complete  Result Date: 05/25/2021 CLINICAL DATA:  midline LBP EXAM: LUMBAR SPINE - COMPLETE 4+ VIEW COMPARISON:  None. FINDINGS: Five non-rib-bearing lumbar vertebral bodies. Multilevel mild degenerative changes of the spine. There is no evidence of lumbar spine fracture. Alignment is normal. Intervertebral disc spaces are maintained. IMPRESSION: No acute displaced fracture or traumatic listhesis of the lumbar spine. Electronically Signed   By: Iven Finn M.D.   On: 05/25/2021 19:02    PROCEDURES:  Critical Care performed: No  Procedures   MEDICATIONS ORDERED IN ED: Medications  ketorolac (TORADOL) 30 MG/ML injection 30 mg (30 mg Intramuscular Given 05/25/21 1904)  orphenadrine (NORFLEX) injection 60 mg (60 mg Intramuscular Given 05/25/21 1905)     IMPRESSION / MDM / ASSESSMENT AND PLAN / ED COURSE  I reviewed the triage vital signs and the nursing notes.                               Differential diagnosis includes, but is not limited to, vaginitis, pregnancy-related conditions, diverticulitis, constipation, colitis  Patient ED evaluation of ongoing generalized abdominal pain with more specific left lower quadrant pelvic discomfort.  Patient was recently treated with a course of metronidazole.  Self collected swab does not confirm any ongoing vaginitis.  UA without signs of acute leukocyturia and hematuria.  CT scan of the abdomen and pelvis reviewed by me does not show any acute intra-abdominal pelvic processes.  No signs of nephrolithiasis, hydronephrosis, or space-occupying mass.  Plain films of the lumbar spine shows no signs of any acute fracture, listhesis, or degenerative changes.  Patient is noted to have a moderate stool burden on plain film.  Patient's diagnosis is consistent with lumbar strain with out complication due to constipation. Patient will be discharged home with prescriptions for baclofen, Relafen for the musculoskeletal pain, and instructions to use a bowel prep regimen including MiraLAX and Gatorade to promote stool.  A courtesy prescription refill of her nitrofurantoin and potassium pills are provided. Patient is to follow up with  her PCP as needed or otherwise directed. Patient is given ED precautions to return to the ED for any worsening or new symptoms.   FINAL CLINICAL IMPRESSION(S) / ED DIAGNOSES   Final diagnoses:  Constipation, unspecified constipation type  Lumbar strain, initial encounter  Diverticulosis     Rx / DC Orders   ED Discharge Orders          Ordered    nitrofurantoin, macrocrystal-monohydrate, (MACROBID) 100 MG capsule  Daily        05/25/21 1937    baclofen (LIORESAL) 10 MG tablet  3 times daily PRN        05/25/21 1937    nabumetone (RELAFEN) 750 MG tablet  2 times daily        05/25/21 1937    potassium chloride (KLOR-CON M) 10 MEQ tablet  Daily        05/25/21 1945             Note:  This document was  prepared using Dragon voice recognition software and may include unintentional dictation errors.    Melvenia Needles, PA-C 05/25/21 1956    Naaman Plummer, MD 05/25/21 [phone]

## 2021-05-25 NOTE — ED Notes (Signed)
Pt was seen a urgent care for same and given abx. Pt finished abx and states symptoms are worse. Pt has lower abd pain and pelvic pain

## 2021-05-25 NOTE — ED Triage Notes (Signed)
Pt to ED for continued sx from BV. Treated for BV on 1/20, states has finished her antibiotics. States vaginal bleeding has stopped after finishing meds but is still feeling uncomfortable.  Hx chronic suprapubic pain

## 2021-05-25 NOTE — ED Notes (Signed)
Pt back from x-ray.

## 2021-07-03 ENCOUNTER — Encounter: Payer: Self-pay | Admitting: *Deleted

## 2022-10-13 IMAGING — CR DG LUMBAR SPINE COMPLETE 4+V
5 series · 5 of 5 positions shown · non-contrast
Comparison: None.

CLINICAL DATA: midline LBP

EXAM:
LUMBAR SPINE - COMPLETE 4+ VIEW

[l-spine ap]
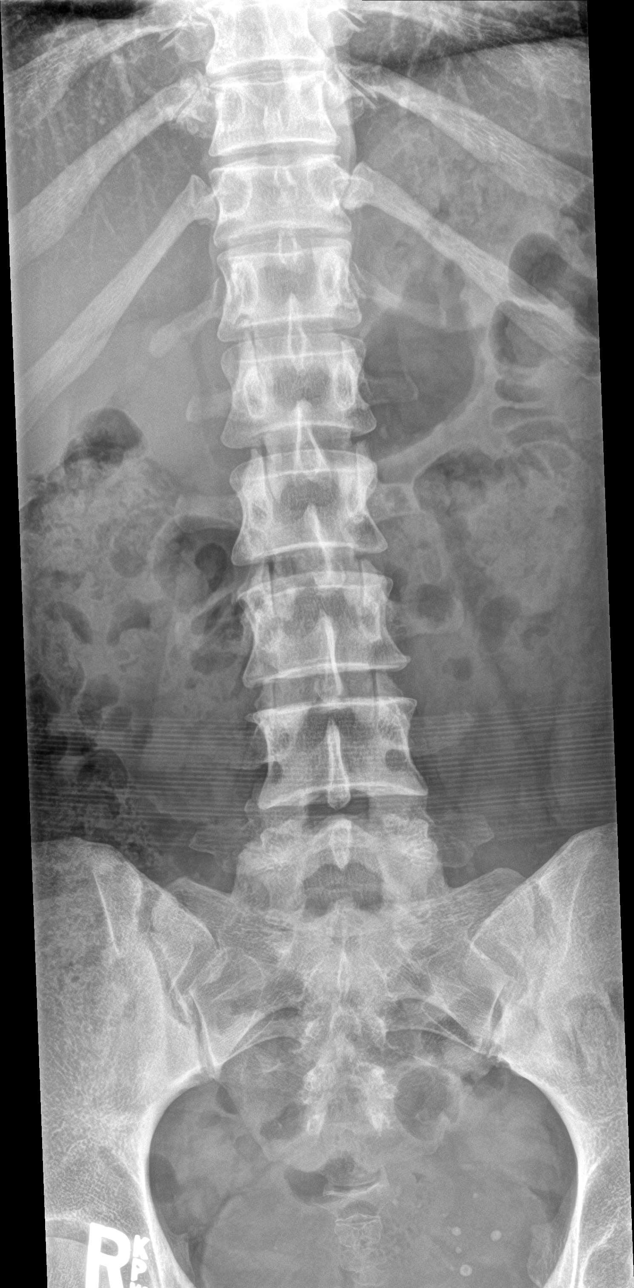

[l-spine obl (1 of 2)]
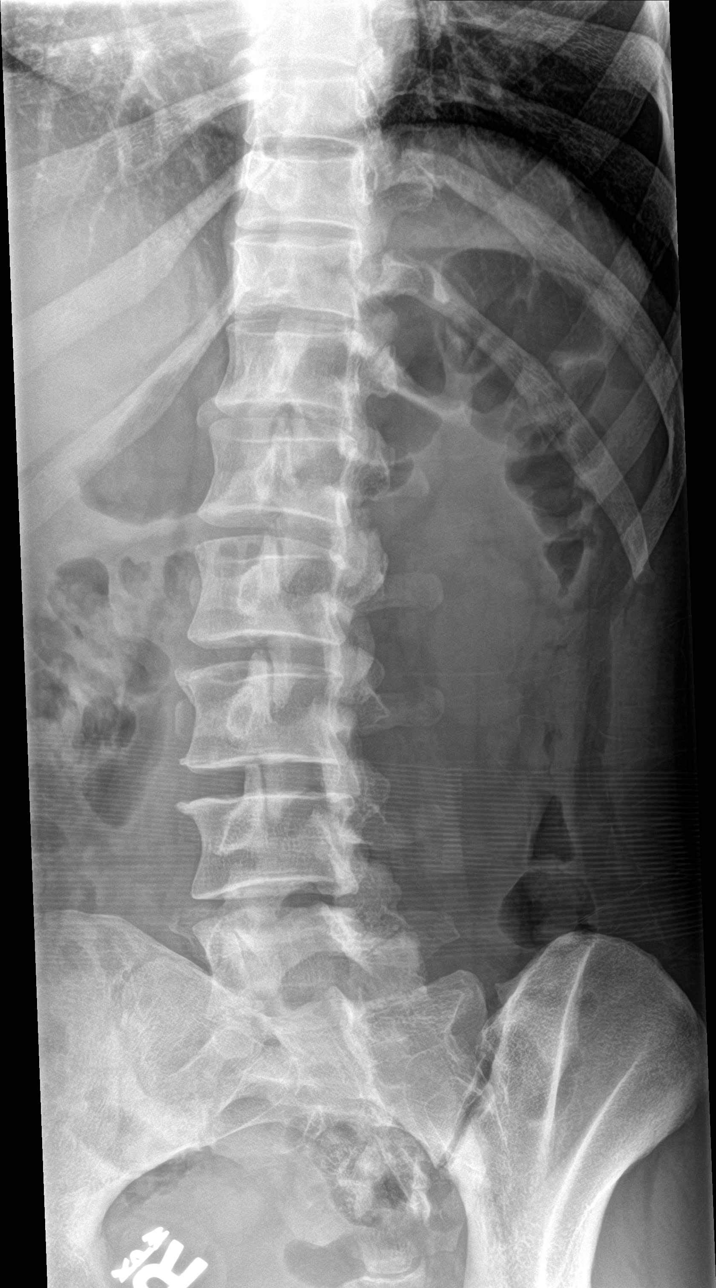

[l-spine obl (2 of 2)]
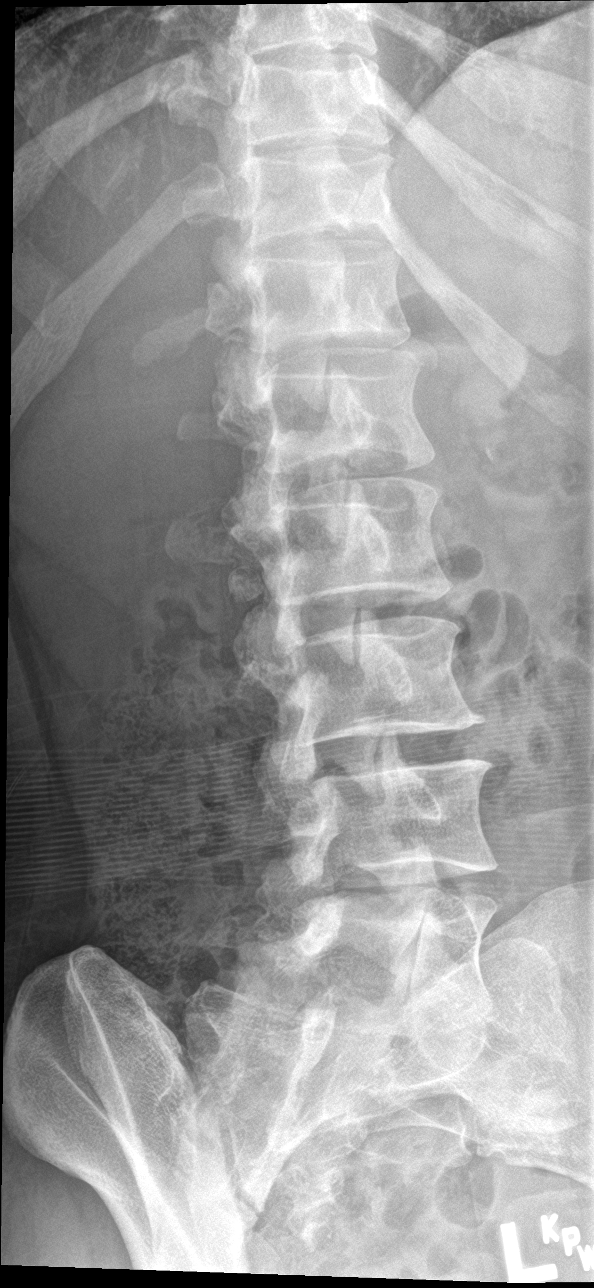

[l-spine lat]
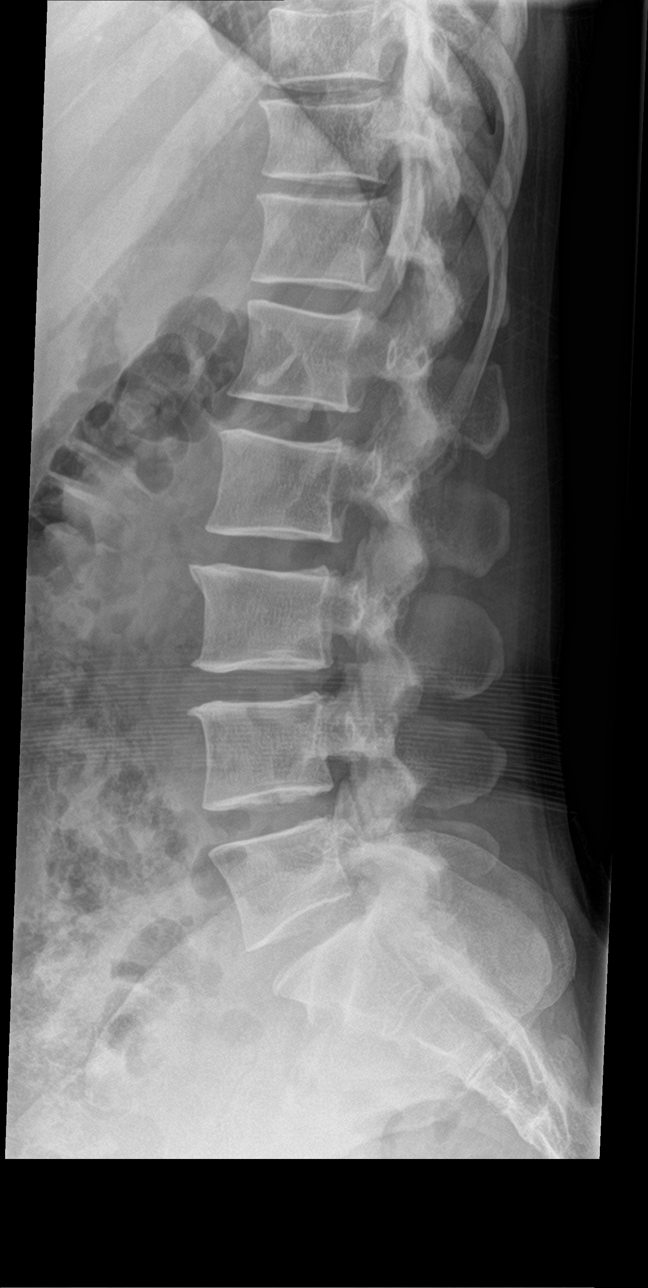

[l-spine spot]
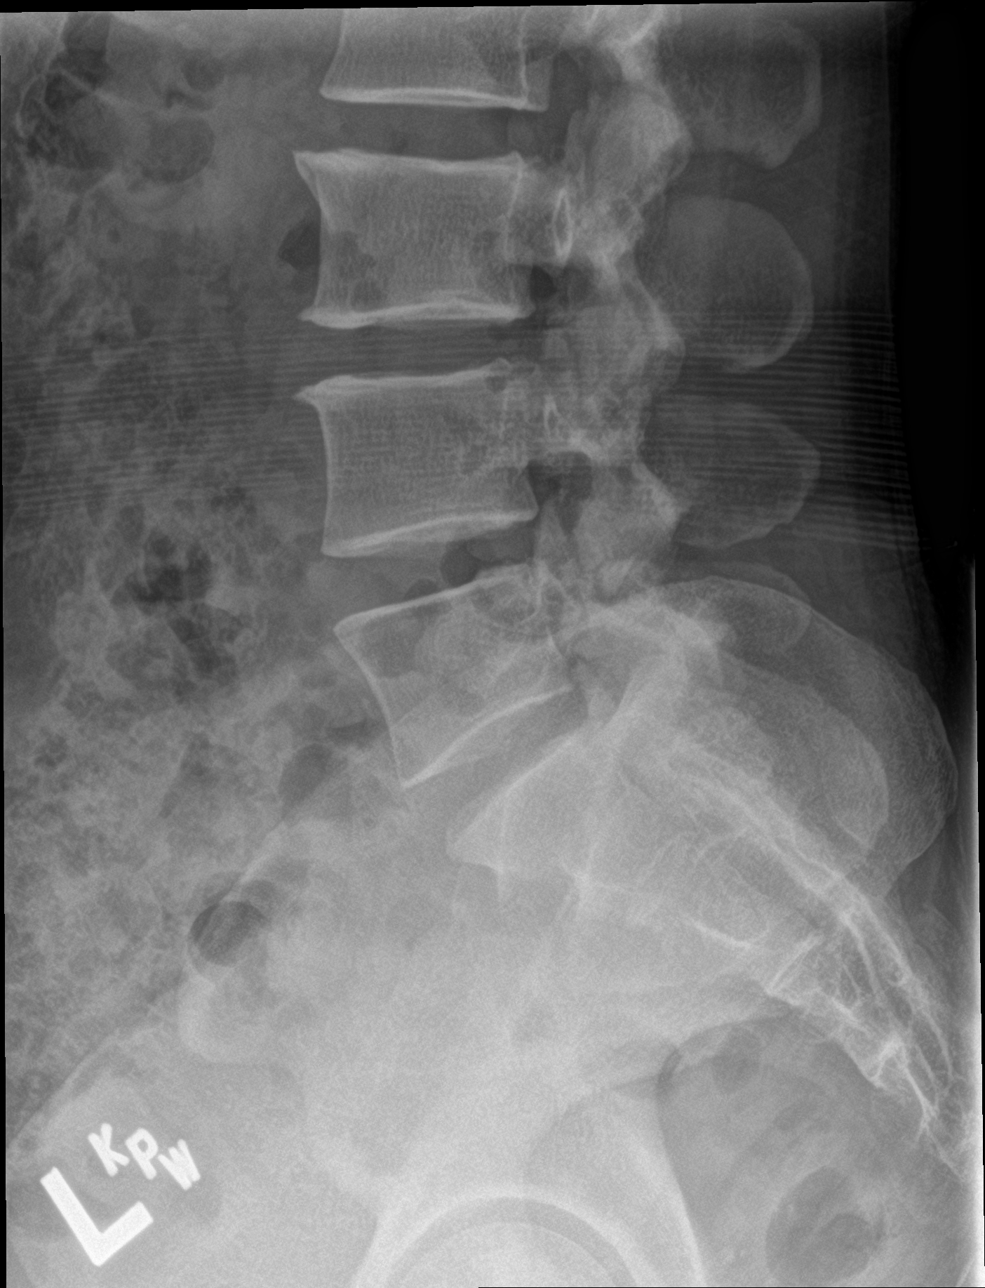

[5 of 5 positions shown; findings below may reference images not displayed]

FINDINGS: Five non-rib-bearing lumbar vertebral bodies. Multilevel mild
degenerative changes of the spine. There is no evidence of lumbar
spine fracture. Alignment is normal. Intervertebral disc spaces are
maintained.
IMPRESSION: No acute displaced fracture or traumatic listhesis of the lumbar
spine.
